# Patient Record
Sex: Female | Born: 1940 | Race: White | Hispanic: No | Marital: Single | Smoking: Never smoker
Health system: Southern US, Community
[De-identification: ages and names within clinical notes are randomized; demographics above are authoritative.]

## PROBLEM LIST (undated history)

## (undated) DIAGNOSIS — M67442 Ganglion, left hand: Secondary | ICD-10-CM

## (undated) DIAGNOSIS — E785 Hyperlipidemia, unspecified: Secondary | ICD-10-CM

## (undated) HISTORY — PX: DENTAL SURGERY: SHX609

## (undated) HISTORY — PX: EYE SURGERY: SHX253

## (undated) HISTORY — PX: FOOT SURGERY: SHX648

## (undated) HISTORY — PX: BACK SURGERY: SHX140

## (undated) HISTORY — PX: BUNIONECTOMY: SHX129

---

## 1998-10-24 ENCOUNTER — Emergency Department (HOSPITAL_COMMUNITY): Admission: EM | Admit: 1998-10-24 | Discharge: 1998-10-24 | Payer: Self-pay | Admitting: Emergency Medicine

## 1998-10-24 ENCOUNTER — Encounter: Payer: Self-pay | Admitting: Emergency Medicine

## 1998-10-28 ENCOUNTER — Emergency Department (HOSPITAL_COMMUNITY): Admission: EM | Admit: 1998-10-28 | Discharge: 1998-10-28 | Payer: Self-pay | Admitting: Emergency Medicine

## 1998-11-03 ENCOUNTER — Emergency Department (HOSPITAL_COMMUNITY): Admission: EM | Admit: 1998-11-03 | Discharge: 1998-11-03 | Payer: Self-pay | Admitting: Emergency Medicine

## 1999-01-31 ENCOUNTER — Other Ambulatory Visit: Admission: RE | Admit: 1999-01-31 | Discharge: 1999-01-31 | Payer: Self-pay | Admitting: Internal Medicine

## 2002-11-08 ENCOUNTER — Encounter: Admission: RE | Admit: 2002-11-08 | Discharge: 2002-11-08 | Payer: Self-pay | Admitting: Orthopedic Surgery

## 2002-11-08 ENCOUNTER — Encounter: Payer: Self-pay | Admitting: Orthopedic Surgery

## 2004-12-18 ENCOUNTER — Emergency Department (HOSPITAL_COMMUNITY): Admission: EM | Admit: 2004-12-18 | Discharge: 2004-12-18 | Payer: Self-pay | Admitting: Emergency Medicine

## 2005-11-18 ENCOUNTER — Ambulatory Visit: Payer: Self-pay | Admitting: Internal Medicine

## 2005-11-28 ENCOUNTER — Ambulatory Visit: Payer: Self-pay | Admitting: Internal Medicine

## 2005-11-28 ENCOUNTER — Encounter (INDEPENDENT_AMBULATORY_CARE_PROVIDER_SITE_OTHER): Payer: Self-pay | Admitting: Specialist

## 2006-02-24 ENCOUNTER — Encounter: Admission: RE | Admit: 2006-02-24 | Discharge: 2006-03-24 | Payer: Self-pay | Admitting: Internal Medicine

## 2006-12-31 ENCOUNTER — Encounter: Admission: RE | Admit: 2006-12-31 | Discharge: 2006-12-31 | Payer: Self-pay | Admitting: Internal Medicine

## 2009-08-22 ENCOUNTER — Emergency Department (HOSPITAL_COMMUNITY): Admission: EM | Admit: 2009-08-22 | Discharge: 2009-08-23 | Payer: Self-pay | Admitting: Emergency Medicine

## 2009-09-06 ENCOUNTER — Encounter: Admission: RE | Admit: 2009-09-06 | Discharge: 2009-09-06 | Payer: Self-pay | Admitting: Internal Medicine

## 2010-06-23 ENCOUNTER — Encounter: Payer: Self-pay | Admitting: Internal Medicine

## 2010-08-19 ENCOUNTER — Other Ambulatory Visit: Payer: Self-pay | Admitting: Internal Medicine

## 2010-08-19 DIAGNOSIS — Z1231 Encounter for screening mammogram for malignant neoplasm of breast: Secondary | ICD-10-CM

## 2010-08-26 LAB — CBC
HCT: 35.6 % — ABNORMAL LOW (ref 36.0–46.0)
Hemoglobin: 12 g/dL (ref 12.0–15.0)
MCHC: 33.7 g/dL (ref 30.0–36.0)
RDW: 13.9 % (ref 11.5–15.5)

## 2010-08-26 LAB — BRAIN NATRIURETIC PEPTIDE: Pro B Natriuretic peptide (BNP): <30 pg/mL (ref 0.0–100.0)

## 2010-08-26 LAB — COMPREHENSIVE METABOLIC PANEL
Alkaline Phosphatase: 52 U/L (ref 39–117)
BUN: 20 mg/dL (ref 6–23)
Calcium: 9.1 mg/dL (ref 8.4–10.5)
GFR calc non Af Amer: >60 mL/min (ref >60)
Glucose, Bld: 100 mg/dL — ABNORMAL HIGH (ref 70–99)
Total Protein: 6.7 g/dL (ref 6.0–8.3)

## 2010-08-26 LAB — POCT CARDIAC MARKERS
Myoglobin, poc: 31.6 ng/mL (ref 12–200)
Troponin i, poc: <0.05 ng/mL (ref 0.00–0.09)

## 2010-08-26 LAB — DIFFERENTIAL
Basophils Relative: 0 % (ref 0–1)
Lymphs Abs: 2.4 10*3/uL (ref 0.7–4.0)
Monocytes Relative: 9 % (ref 3–12)
Neutro Abs: 6 10*3/uL (ref 1.7–7.7)
Neutrophils Relative %: 63 % (ref 43–77)

## 2010-08-26 LAB — D-DIMER, QUANTITATIVE: D-Dimer, Quant: 0.25 ug/mL-FEU (ref 0.00–0.48)

## 2010-10-08 ENCOUNTER — Ambulatory Visit: Payer: Self-pay

## 2010-10-08 ENCOUNTER — Ambulatory Visit
Admission: RE | Admit: 2010-10-08 | Discharge: 2010-10-08 | Disposition: A | Discharge status: Home / Self Care | Payer: Medicare Other | Source: Ambulatory Visit | Attending: Internal Medicine | Admitting: Internal Medicine

## 2010-10-08 DIAGNOSIS — Z1231 Encounter for screening mammogram for malignant neoplasm of breast: Secondary | ICD-10-CM

## 2010-10-09 ENCOUNTER — Other Ambulatory Visit: Payer: Self-pay | Admitting: Internal Medicine

## 2010-10-09 DIAGNOSIS — R928 Other abnormal and inconclusive findings on diagnostic imaging of breast: Secondary | ICD-10-CM

## 2010-10-16 ENCOUNTER — Ambulatory Visit
Admission: RE | Admit: 2010-10-16 | Discharge: 2010-10-16 | Disposition: A | Discharge status: Home / Self Care | Payer: Medicare Other | Source: Ambulatory Visit | Attending: Internal Medicine | Admitting: Internal Medicine

## 2010-10-16 DIAGNOSIS — R928 Other abnormal and inconclusive findings on diagnostic imaging of breast: Secondary | ICD-10-CM

## 2010-12-17 ENCOUNTER — Encounter: Payer: Self-pay | Admitting: Internal Medicine

## 2011-08-21 ENCOUNTER — Other Ambulatory Visit: Payer: Self-pay | Admitting: Orthopedic Surgery

## 2011-08-22 ENCOUNTER — Encounter (HOSPITAL_BASED_OUTPATIENT_CLINIC_OR_DEPARTMENT_OTHER): Payer: Self-pay | Admitting: *Deleted

## 2011-08-25 ENCOUNTER — Encounter (HOSPITAL_BASED_OUTPATIENT_CLINIC_OR_DEPARTMENT_OTHER): Payer: Self-pay | Admitting: Anesthesiology

## 2011-08-25 ENCOUNTER — Encounter (HOSPITAL_BASED_OUTPATIENT_CLINIC_OR_DEPARTMENT_OTHER)
Admission: RE | Disposition: A | Discharge status: Home / Self Care | Payer: Self-pay | Source: Ambulatory Visit | Attending: Orthopedic Surgery

## 2011-08-25 ENCOUNTER — Encounter (HOSPITAL_BASED_OUTPATIENT_CLINIC_OR_DEPARTMENT_OTHER): Payer: Self-pay | Admitting: Orthopedic Surgery

## 2011-08-25 ENCOUNTER — Encounter (HOSPITAL_BASED_OUTPATIENT_CLINIC_OR_DEPARTMENT_OTHER): Payer: Self-pay | Admitting: *Deleted

## 2011-08-25 ENCOUNTER — Ambulatory Visit (HOSPITAL_BASED_OUTPATIENT_CLINIC_OR_DEPARTMENT_OTHER): Payer: Medicare Other | Admitting: Anesthesiology

## 2011-08-25 ENCOUNTER — Ambulatory Visit (HOSPITAL_BASED_OUTPATIENT_CLINIC_OR_DEPARTMENT_OTHER)
Admission: RE | Admit: 2011-08-25 | Discharge: 2011-08-25 | Disposition: A | Discharge status: Home / Self Care | Payer: Medicare Other | Source: Ambulatory Visit | Attending: Orthopedic Surgery | Admitting: Orthopedic Surgery

## 2011-08-25 DIAGNOSIS — M6740 Ganglion, unspecified site: Secondary | ICD-10-CM | POA: Insufficient documentation

## 2011-08-25 HISTORY — PX: MASS EXCISION: SHX2000

## 2011-08-25 SURGERY — EXCISION MASS
Anesthesia: Regional | Site: Finger | Laterality: Left | Wound class: Clean

## 2011-08-25 MED ORDER — LACTATED RINGERS IV SOLN
INTRAVENOUS | Status: DC
  Administered 2011-08-25: 14:00:00 via INTRAVENOUS

## 2011-08-25 MED ORDER — FENTANYL CITRATE 0.05 MG/ML IJ SOLN
INTRAMUSCULAR | Status: DC | PRN
  Administered 2011-08-25: 100 ug via INTRAVENOUS

## 2011-08-25 MED ORDER — FENTANYL CITRATE 0.05 MG/ML IJ SOLN: 50.0000 ug | INTRAMUSCULAR | Status: DC | PRN

## 2011-08-25 MED ORDER — CEFAZOLIN SODIUM 1-5 GM-% IV SOLN
INTRAVENOUS | Status: DC | PRN
  Administered 2011-08-25: 1 g via INTRAVENOUS

## 2011-08-25 MED ORDER — BUPIVACAINE HCL (PF) 0.25 % IJ SOLN
INTRAMUSCULAR | Status: DC | PRN
  Administered 2011-08-25: 5 mL

## 2011-08-25 MED ORDER — METOCLOPRAMIDE HCL 5 MG/ML IJ SOLN: 10.0000 mg | Freq: Once | INTRAMUSCULAR | Status: DC | PRN

## 2011-08-25 MED ORDER — MORPHINE SULFATE 2 MG/ML IJ SOLN: 0.0500 mg/kg | INTRAMUSCULAR | Status: DC | PRN

## 2011-08-25 MED ORDER — CHLORHEXIDINE GLUCONATE 4 % EX LIQD: 60.0000 mL | Freq: Once | CUTANEOUS | Status: DC

## 2011-08-25 MED ORDER — MIDAZOLAM HCL 5 MG/5ML IJ SOLN
INTRAMUSCULAR | Status: DC | PRN
  Administered 2011-08-25: 1 mg via INTRAVENOUS

## 2011-08-25 MED ORDER — ONDANSETRON HCL 4 MG/2ML IJ SOLN
INTRAMUSCULAR | Status: DC | PRN
  Administered 2011-08-25: 4 mg via INTRAVENOUS

## 2011-08-25 MED ORDER — MIDAZOLAM HCL 2 MG/2ML IJ SOLN: 0.5000 mg | INTRAMUSCULAR | Status: DC | PRN

## 2011-08-25 MED ORDER — PROPOFOL 10 MG/ML IV EMUL
INTRAVENOUS | Status: DC | PRN
  Administered 2011-08-25: 50 ug/kg/min via INTRAVENOUS

## 2011-08-25 MED ORDER — HYDROCODONE-ACETAMINOPHEN 5-500 MG PO TABS: 1.0000 | ORAL_TABLET | ORAL | Status: AC | PRN

## 2011-08-25 MED ORDER — FENTANYL CITRATE 0.05 MG/ML IJ SOLN: 25.0000 ug | INTRAMUSCULAR | Status: DC | PRN

## 2011-08-25 MED ORDER — LIDOCAINE HCL (PF) 0.5 % IJ SOLN
INTRAMUSCULAR | Status: DC | PRN
  Administered 2011-08-25: 30 mL via INTRATHECAL

## 2011-08-25 SURGICAL SUPPLY — 51 items
BANDAGE COBAN STERILE 2 (GAUZE/BANDAGES/DRESSINGS) IMPLANT
BANDAGE GAUZE ELAST BULKY 4 IN (GAUZE/BANDAGES/DRESSINGS) IMPLANT
BLADE MINI RND TIP GREEN BEAV (BLADE) IMPLANT
BLADE SURG 15 STRL LF DISP TIS (BLADE) ×1 IMPLANT
BLADE SURG 15 STRL SS (BLADE) ×2
BNDG CMPR 9X4 STRL LF SNTH (GAUZE/BANDAGES/DRESSINGS)
BNDG COHESIVE 1X5 TAN STRL LF (GAUZE/BANDAGES/DRESSINGS) ×1 IMPLANT
BNDG COHESIVE 3X5 TAN STRL LF (GAUZE/BANDAGES/DRESSINGS) IMPLANT
BNDG ESMARK 4X9 LF (GAUZE/BANDAGES/DRESSINGS) IMPLANT
CHLORAPREP W/TINT 26ML (MISCELLANEOUS) ×2 IMPLANT
CLOTH BEACON ORANGE TIMEOUT ST (SAFETY) ×2 IMPLANT
CORDS BIPOLAR (ELECTRODE) ×2 IMPLANT
COVER MAYO STAND STRL (DRAPES) ×2 IMPLANT
COVER TABLE BACK 60X90 (DRAPES) ×2 IMPLANT
CUFF TOURNIQUET SINGLE 18IN (TOURNIQUET CUFF) ×2 IMPLANT
DECANTER SPIKE VIAL GLASS SM (MISCELLANEOUS) IMPLANT
DRAIN PENROSE 1/2X12 LTX STRL (WOUND CARE) IMPLANT
DRAPE EXTREMITY T 121X128X90 (DRAPE) ×2 IMPLANT
DRAPE SURG 17X23 STRL (DRAPES) ×2 IMPLANT
GAUZE XEROFORM 1X8 LF (GAUZE/BANDAGES/DRESSINGS) ×2 IMPLANT
GLOVE BIO SURGEON STRL SZ 6.5 (GLOVE) ×1 IMPLANT
GLOVE INDICATOR 8.0 STRL GRN (GLOVE) ×1 IMPLANT
GLOVE SURG ORTHO 8.0 STRL STRW (GLOVE) ×2 IMPLANT
GLOVE SURG SS PI 8.0 STRL IVOR (GLOVE) ×1 IMPLANT
GOWN BRE IMP PREV XXLGXLNG (GOWN DISPOSABLE) ×3 IMPLANT
GOWN PREVENTION PLUS XLARGE (GOWN DISPOSABLE) IMPLANT
NDL SAFETY ECLIPSE 18X1.5 (NEEDLE) ×1 IMPLANT
NEEDLE 27GAX1X1/2 (NEEDLE) ×1 IMPLANT
NEEDLE HYPO 18GX1.5 SHARP (NEEDLE)
NS IRRIG 1000ML POUR BTL (IV SOLUTION) ×2 IMPLANT
PACK BASIN DAY SURGERY FS (CUSTOM PROCEDURE TRAY) ×2 IMPLANT
PAD CAST 3X4 CTTN HI CHSV (CAST SUPPLIES) IMPLANT
PADDING CAST ABS 3INX4YD NS (CAST SUPPLIES)
PADDING CAST ABS 4INX4YD NS (CAST SUPPLIES) ×1
PADDING CAST ABS COTTON 3X4 (CAST SUPPLIES) IMPLANT
PADDING CAST ABS COTTON 4X4 ST (CAST SUPPLIES) ×1 IMPLANT
PADDING CAST COTTON 3X4 STRL (CAST SUPPLIES)
SPLINT FINGER 5/8X3.25 (SOFTGOODS) IMPLANT
SPLINT FINGER FOAM 3 9119 05 (SOFTGOODS) ×2
SPLINT PLASTER CAST XFAST 3X15 (CAST SUPPLIES) IMPLANT
SPLINT PLASTER XTRA FASTSET 3X (CAST SUPPLIES)
SPONGE GAUZE 4X4 12PLY (GAUZE/BANDAGES/DRESSINGS) ×2 IMPLANT
STOCKINETTE 4X48 STRL (DRAPES) ×2 IMPLANT
SUT VIC AB 4-0 P2 18 (SUTURE) IMPLANT
SUT VICRYL RAPID 5 0 P 3 (SUTURE) IMPLANT
SUT VICRYL RAPIDE 4/0 PS 2 (SUTURE) ×2 IMPLANT
SYR BULB 3OZ (MISCELLANEOUS) ×2 IMPLANT
SYR CONTROL 10ML LL (SYRINGE) ×1 IMPLANT
TOWEL OR 17X24 6PK STRL BLUE (TOWEL DISPOSABLE) ×3 IMPLANT
UNDERPAD 30X30 INCONTINENT (UNDERPADS AND DIAPERS) ×2 IMPLANT
WATER STERILE IRR 1000ML POUR (IV SOLUTION) ×1 IMPLANT

## 2011-08-25 NOTE — Discharge Instructions (Addendum)
[facility] Instructions Hand Surgery  Wound Care: Keep your hand elevated above the level of your heart.  Do not allow it to dangle  by your side.  Keep the dressing dry and do not remove it unless your doctor advises you to do so.  He will usually change it at the time of your post-op visit.  Moving your fingers is advised to stimulate circulation but will depend on the site of your surgery.  If you have a splint applied, your doctor will advise you regarding movement.  Activity: Do not drive or operate machinery today.  Rest today and then you may return to your normal activity and work as indicated by your physician.  Diet:  Drink liquids today or eat a light diet.  You may resume a regular diet tomorrow.    General expectations: Pain for two to three days. Fingers may become slightly swollen.  Call your doctor if any of the following occur: Severe pain not relieved by pain medication. Elevated temperature. Dressing soaked with blood. Inability to move fingers. White or bluish color to fingers.[facility]  [address] [place], [place] [address] [phone]   Post Anesthesia Home Care Instructions  Activity: Get plenty of rest for the remainder of the day. A responsible adult should stay with you for 24 hours following the procedure.  For the next 24 hours, DO NOT: -Drive a car -Operate machinery -Drink alcoholic beverages -Take any medication unless instructed by your physician -Make any legal decisions or sign important papers.  Meals: Start with liquid foods such as gelatin or soup. Progress to regular foods as tolerated. Avoid greasy, spicy, heavy foods. If nausea and/or vomiting occur, drink only clear liquids until the nausea and/or vomiting subsides. Call your physician if vomiting continues.  Special Instructions/Symptoms: Your throat may feel dry or sore from the anesthesia or the breathing tube placed in your throat during surgery. If  this causes discomfort, gargle with warm salt water. The discomfort should disappear within 24 hours.   

## 2011-08-25 NOTE — Op Note (Signed)
NAMELIVIE, VANDERHOOF              ACCOUNT NO.:  [id number]  MEDICAL RECORD NO.:  [id number]  LOCATION:                                 FACILITY:  PHYSICIAN:  Cindee Salt, M.D.            DATE OF BIRTH:  DATE OF PROCEDURE:  08/25/2011 DATE OF DISCHARGE:                              OPERATIVE REPORT   PREOPERATIVE DIAGNOSIS:  Mucoid cyst, distal interphalangeal joint, left middle finger.  POSTOPERATIVE DIAGNOSIS:  Mucoid cyst, distal interphalangeal joint, left middle finger.  OPERATION:  Excision of mucoid cyst, debridement of distal interphalangeal joint, left middle finger  SURGEON:  Cindee Salt, M.D.  ANESTHESIA:  Forearm-based IV regional.  ANESTHESIOLOGIST:  Janetta Hora. Gelene Mink, M.D.  HISTORY:  The patient is a 71 year old female with a history of a mass, dorsal aspect of her left middle finger with grooving of the nail plate distally.  X-rays revealed degenerative changes in distal interphalangeal joint.  She is desirous having this excised.  Pre, peri, and postoperative course have been discussed along with risks and complications. She is aware there is no guarantee with surgery; possibility of infection; recurrence; injury to arteries, nerves, tendons; incomplete relief of symptoms; dystrophy; and the possibility of deformity to the nail plate.  In preoperative area, the patient is seen, the extremity marked by both the patient and surgeon.  Antibiotic given.  PROCEDURE:  The patient was brought to the operating room where a forearm-based IV regional anesthetic was carried out without difficulty. She was prepped using ChloraPrep, supine position, left arm free.  A 3- minute dry time was allowed.  Time-out taken, confirming the patient and procedure.  A curvilinear incision was made over the distal interphalangeal joint of left middle finger, carried down through subcutaneous tissue.  The dissection was carried distally beneath the skin.  With blunt and sharp  dissection, the cyst was isolated, this was then opened and removed with a small rongeur, taking care to protect the overlying skin, which was translucent.  This was not ruptured and a portion of cyst was sent to Pathology.  The joint was then opened on its ulnar aspect.  With blunt and sharp dissection, a debridement was then performed again with a small rongeur.  Synovectomy performed.  No further lesions were identified.  The wound was irrigated with saline and the skin closed with Vicryl 5-0 Vicryl Rapide sutures.  A metacarpal block was given with 0.25% Marcaine without epinephrine, 5 mL was used.  Sterile compressive dressing and splint to the finger was applied.  On deflation of the tourniquet, the remaining fingers pinked.  She was taken to the recovery room for observation in satisfactory condition.          ______________________________ Cindee Salt, M.D.     GK/MEDQ  D:  08/25/2011  T:  08/25/2011  Job:  [id number]

## 2011-08-25 NOTE — Brief Op Note (Signed)
08/25/2011  3:08 PM  PATIENT:  Charolette L Carbary  71 y.o. female  PRE-OPERATIVE DIAGNOSIS:  mucoid tumor left middle finger  POST-OPERATIVE DIAGNOSIS:  Mucoid Tumor Left Middle Finger  PROCEDURE:  Procedure(s) (LRB): EXCISION MASS (Left)  SURGEON:  Surgeon(s) and Role:    * Nicki Reaper, MD - Primary  PHYSICIAN ASSISTANT:   ASSISTANTS: none   ANESTHESIA:   local and regional  EBL:     BLOOD ADMINISTERED:none  DRAINS: none   LOCAL MEDICATIONS USED:  MARCAINE     SPECIMEN:  Excision  DISPOSITION OF SPECIMEN:  PATHOLOGY  COUNTS:  YES  TOURNIQUET:   Total Tourniquet Time Documented: Forearm (Left) - 21 minutes  DICTATION: .Other Dictation: Dictation Number [id number]  PLAN OF CARE: Discharge to home after PACU  PATIENT DISPOSITION:  PACU - hemodynamically stable.

## 2011-08-25 NOTE — H&P (Signed)
  Meredith Hughes  is a 71 year-old left-hand dominant female referred by Dr. Arminda Resides for consultation with respect to a mass on her left little finger distal phalanx nail bed.  She states that this has been present for approximately three months. She recalls no history of injury.  She states that it has not opened or drained anything. She states that she noticed this after having a manicure done with the deformity of the nail which they were unable to correct.  She has no history of diabetes, thyroid problems, arthritis or gout.  There is a family history of arthritis.  She complains of an intermittent moderate throbbing aching type pain with a feeling of swelling and the deformity to the nail plate.  She states rest makes it better.  ALLERGIES:   Sulfa.  MEDICATIONS:    Acyclovir, simvastatin.   SURGICAL HISTORY:     Back surgery, dental implantation surgery, bunion surgery.   FAMILY MEDICAL HISTORY:    Positive for heart disease, arthritis.   SOCIAL HISTORY:    She does not smoke or drink.  She is divorced.    REVIEW OF SYSTEMS:   Positive for cataracts, hearing loss, easy bruising, otherwise negative.   Meredith Hughes is an 71 y.o. female.   Chief Complaint: mucoid cyst lmf  HPI: see above  History reviewed. No pertinent past medical history.  Past Surgical History  Procedure Date  . Back surgery   . Eye surgery     LT CATARACT  . Dental surgery     IMPLANTS  . Bunionectomy     LT  . Foot surgery     RT    History reviewed. No pertinent family history. Social History:  reports that she has never smoked. She does not have any smokeless tobacco history on file. She reports that she does not use illicit drugs. Her alcohol history not on file.  Allergies:  Allergies  Allergen Reactions  . Sulfa Antibiotics Rash    No current facility-administered medications on file as of .   Medications Prior to Admission  Medication Sig Dispense Refill  . acyclovir (ZOVIRAX) 400 MG tablet  Take 400 mg by mouth every 4 (four) hours while awake.      . simvastatin (ZOCOR) 10 MG tablet Take 10 mg by mouth at bedtime.        No results found for this or any previous visit (from the past 48 hour(s)).  No results found.   Pertinent items are noted in HPI.  Height 5\' 4"  (1.626 m), weight 66.679 kg (147 lb).  General appearance: alert, cooperative and appears stated age Head: Normocephalic, without obvious abnormality Neck: no adenopathy Resp: clear to auscultation bilaterally Cardio: regular rate and rhythm, S1, S2 normal, no murmur, click, rub or gallop GI: soft, non-tender; bowel sounds normal; no masses,  no organomegaly Extremities: extremities normal, atraumatic, no cyanosis or edema Pulses: 2+ and symmetric Skin: Skin color, texture, turgor normal. No rashes or lesions Neurologic: Grossly normal Incision/Wound: na  Assessment/Plan   We would recommend surgical excision, debridement of the joint.  The pre, peri and postoperative course were discussed along with the risks and complications.  The patient is aware there is no guarantee with the surgery, possibility of infection, recurrence, injury to arteries, nerves, tendons, recurrence.  She would like to proceed to have this done.    Rosalea Withrow R 08/25/2011, 12:19 PM

## 2011-08-25 NOTE — Transfer of Care (Signed)
Immediate Anesthesia Transfer of Care Note  Patient: Meredith Hughes  Procedure(s) Performed: Procedure(s) (LRB): EXCISION MASS (Left)  Patient Location: PACU  Anesthesia Type: MAC and Bier block  Level of Consciousness: awake, alert  and oriented  Airway & Oxygen Therapy: Patient Spontanous Breathing and Patient connected to face mask oxygen  Post-op Assessment: Report given to PACU RN and Post -op Vital signs reviewed and stable  Post vital signs: Reviewed and stable  Complications: No apparent anesthesia complications

## 2011-08-25 NOTE — Anesthesia Postprocedure Evaluation (Signed)
Anesthesia Post Note  Patient: Meredith Hughes  Procedure(s) Performed: Procedure(s) (LRB): EXCISION MASS (Left)  Anesthesia type: MAC  Patient location: PACU  Post pain: Pain level controlled  Post assessment: Patient's Cardiovascular Status Stable  Last Vitals:  Filed Vitals:   08/25/11 1545  BP:   Pulse: 55  Temp:   Resp: 15    Post vital signs: Reviewed and stable  Level of consciousness: alert  Complications: No apparent anesthesia complications

## 2011-08-25 NOTE — Anesthesia Preprocedure Evaluation (Signed)
Anesthesia Evaluation  Patient identified by MRN, date of birth, ID band Patient awake    Reviewed: Allergy & Precautions, H&P , NPO status , Patient's Chart, lab work & pertinent test results, reviewed documented beta blocker date and time   Airway Mallampati: II TM Distance: >3 FB Neck ROM: full    Dental   Pulmonary neg pulmonary ROS,          Cardiovascular negative cardio ROS      Neuro/Psych negative neurological ROS  negative psych ROS   GI/Hepatic negative GI ROS, Neg liver ROS,   Endo/Other  negative endocrine ROS  Renal/GU negative Renal ROS  negative genitourinary   Musculoskeletal   Abdominal   Peds  Hematology negative hematology ROS (+)   Anesthesia Other Findings See surgeon's H&P   Reproductive/Obstetrics negative OB ROS                           Anesthesia Physical Anesthesia Plan  ASA: II  Anesthesia Plan: MAC and Bier Block   Post-op Pain Management:    Induction: Intravenous  Airway Management Planned: Simple Face Mask  Additional Equipment:   Intra-op Plan:   Post-operative Plan:   Informed Consent: I have reviewed the patients History and Physical, chart, labs and discussed the procedure including the risks, benefits and alternatives for the proposed anesthesia with the patient or authorized representative who has indicated his/her understanding and acceptance.     Plan Discussed with: CRNA and Surgeon  Anesthesia Plan Comments:         Anesthesia Quick Evaluation

## 2011-08-25 NOTE — Op Note (Signed)
Dictated [id number]

## 2011-08-26 ENCOUNTER — Encounter (HOSPITAL_BASED_OUTPATIENT_CLINIC_OR_DEPARTMENT_OTHER): Payer: Self-pay | Admitting: Orthopedic Surgery

## 2011-08-26 LAB — POCT HEMOGLOBIN-HEMACUE: Hemoglobin: 12.6 g/dL (ref 12.0–15.0)

## 2011-09-28 ENCOUNTER — Emergency Department (HOSPITAL_COMMUNITY): Payer: Medicare Other

## 2011-09-28 ENCOUNTER — Encounter (HOSPITAL_COMMUNITY): Payer: Self-pay | Admitting: Emergency Medicine

## 2011-09-28 ENCOUNTER — Emergency Department (HOSPITAL_COMMUNITY)
Admission: EM | Admit: 2011-09-28 | Discharge: 2011-09-29 | Disposition: A | Discharge status: Home / Self Care | Payer: Medicare Other | Attending: Emergency Medicine | Admitting: Emergency Medicine

## 2011-09-28 DIAGNOSIS — J159 Unspecified bacterial pneumonia: Secondary | ICD-10-CM | POA: Insufficient documentation

## 2011-09-28 DIAGNOSIS — R21 Rash and other nonspecific skin eruption: Secondary | ICD-10-CM | POA: Insufficient documentation

## 2011-09-28 DIAGNOSIS — E785 Hyperlipidemia, unspecified: Secondary | ICD-10-CM | POA: Insufficient documentation

## 2011-09-28 HISTORY — DX: Hyperlipidemia, unspecified: E78.5

## 2011-09-28 LAB — CBC
HCT: 35.7 % — ABNORMAL LOW (ref 36.0–46.0)
Hemoglobin: 12.4 g/dL (ref 12.0–15.0)
MCH: 30.3 pg (ref 26.0–34.0)
MCHC: 34.7 g/dL (ref 30.0–36.0)
MCV: 87.3 fL (ref 78.0–100.0)
Platelets: 176 10*3/uL (ref 150–400)
RBC: 4.09 MIL/uL (ref 3.87–5.11)
RDW: 13.1 % (ref 11.5–15.5)
WBC: 8.3 10*3/uL (ref 4.0–10.5)

## 2011-09-28 LAB — DIFFERENTIAL
Basophils Absolute: 0 10*3/uL (ref 0.0–0.1)
Basophils Relative: 0 % (ref 0–1)
Eosinophils Absolute: 0.2 10*3/uL (ref 0.0–0.7)
Eosinophils Relative: 2 % (ref 0–5)
Lymphocytes Relative: 6 % — ABNORMAL LOW (ref 12–46)
Lymphs Abs: 0.5 10*3/uL — ABNORMAL LOW (ref 0.7–4.0)
Monocytes Absolute: 0.3 10*3/uL (ref 0.1–1.0)
Monocytes Relative: 3 % (ref 3–12)
Neutro Abs: 7.4 10*3/uL (ref 1.7–7.7)
Neutrophils Relative %: 89 % — ABNORMAL HIGH (ref 43–77)

## 2011-09-28 LAB — COMPREHENSIVE METABOLIC PANEL
ALT: 21 U/L (ref 0–35)
AST: 24 U/L (ref 0–37)
Albumin: 4.1 g/dL (ref 3.5–5.2)
Alkaline Phosphatase: 61 U/L (ref 39–117)
BUN: 15 mg/dL (ref 6–23)
CO2: 22 mEq/L (ref 19–32)
Calcium: 8.9 mg/dL (ref 8.4–10.5)
Chloride: 97 mEq/L (ref 96–112)
Creatinine, Ser: 0.69 mg/dL (ref 0.50–1.10)
GFR calc Af Amer: >90 mL/min (ref >90)
GFR calc non Af Amer: 84 mL/min — ABNORMAL LOW (ref >90)
Glucose, Bld: 105 mg/dL — ABNORMAL HIGH (ref 70–99)
Potassium: 3.7 mEq/L (ref 3.5–5.1)
Sodium: 132 mEq/L — ABNORMAL LOW (ref 135–145)
Total Bilirubin: 0.6 mg/dL (ref 0.3–1.2)
Total Protein: 6.9 g/dL (ref 6.0–8.3)

## 2011-09-28 LAB — URINALYSIS, ROUTINE W REFLEX MICROSCOPIC
Bilirubin Urine: NEGATIVE
Glucose, UA: NEGATIVE mg/dL
Ketones, ur: NEGATIVE mg/dL
Nitrite: NEGATIVE
Protein, ur: NEGATIVE mg/dL
Specific Gravity, Urine: 1.01 (ref 1.005–1.030)
Urobilinogen, UA: 0.2 mg/dL (ref 0.0–1.0)
pH: 6 (ref 5.0–8.0)

## 2011-09-28 LAB — URINE MICROSCOPIC-ADD ON

## 2011-09-28 MED ORDER — ACETAMINOPHEN 325 MG PO TABS
650.0000 mg | ORAL_TABLET | Freq: Once | ORAL | Status: AC
  Administered 2011-09-28: 650 mg via ORAL
  Filled 2011-09-28: qty 2
  Filled 2011-09-28: qty 3

## 2011-09-28 MED ORDER — ACETAMINOPHEN 325 MG PO TABS: 325.0000 mg | ORAL_TABLET | Freq: Once | ORAL | Status: DC

## 2011-09-28 NOTE — ED Provider Notes (Signed)
Patient presents to the emergency room with a systemic rash and some chills. The patient's x-rays suggest the possibility of a pneumonia. The patient's rash is diffuse maculopapular on the extremities and torso.  There's been a recent outbreak of measles. The rash is potentially concerning for that. Patient will be moved into an isolation room. We are waiting on the results first CMP. On exam she does not appear toxic or in any acute distress.  Celene Kras, MD 09/28/11 2239

## 2011-09-28 NOTE — ED Notes (Signed)
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  Patient states that they do not itch.

## 2011-09-28 NOTE — ED Provider Notes (Deleted)
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 Friday evening she noted a red rash to BUE's that was mildly pruritic and TTP. Pt noted mild chills by Friday night, took Tylenol and felt better. Sat am the rash seemed to be better and she worked her part time job as usual. By Sat night pt had repeated onset of chills and temp of 103. Pt states at this point rash worsening and now noted to abd, back, BLE's and BUE's. Sat night pt describes rigors w/ her fever. This am again rash appeared to be improving and pt states she was feeling normal. Worked again today and bebore leaving work she noted again worsening rash, chills and fever. Pt denies CP, N/V/D, SOB, URI sx's or any recent illnesses. States she recently visited her sister in [place] who was ill with "a parasite" which caused GI sx's but has had no other known ill contacts. BBS CTA, HR 92 w/ RRR, Diffuse erythematous,  maculopapular rash to torso, BUE's and BLE's. Pt has been on Acyclovir for 4 to 5 yrs s/p a "rash" that was Shingles like in nature though was never dx'd w/ shingles and staes the rash was never painful. Only other PMH is high cholesterol.  Dr Roselyn Bering has seen pt. Will move to exam room.  Leanne Chang, NP 09/28/11 [phone]

## 2011-09-29 MED ORDER — MOXIFLOXACIN HCL 400 MG PO TABS
400.0000 mg | ORAL_TABLET | Freq: Once | ORAL | Status: AC
  Administered 2011-09-29: 400 mg via ORAL
  Filled 2011-09-29: qty 1

## 2011-09-29 NOTE — ED Notes (Signed)
Patient is AOx4 and comfortable with her discharge instructions. 

## 2011-09-29 NOTE — ED Provider Notes (Signed)
History     CSN: [id number]  Arrival date & time 09/28/11  1932   First MD Initiated Contact with Patient 09/28/11 2008      Chief Complaint  Patient presents with  . Rash    HPI: Patient is a 71 y.o. female presenting with rash. The history is provided by the patient.  Rash  This is a new problem. The current episode started more than 2 days ago. The problem has been gradually worsening. The problem is associated with plant contact. The maximum temperature recorded prior to her arrival was 103 to 104 F. The fever has been present for 1 to 2 days. The rash is present on the trunk, right arm, right upper leg, right lower leg, left upper leg and left arm. The pain is at a severity of 0/10. The patient is experiencing no pain. The pain has been constant since onset. Associated symptoms include pain. Pertinent negatives include no blisters, no itching and no weeping.  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 Friday evening she noted a red rash to BUE's that was mildly pruritic and TTP. Pt noted mild chills by Friday night, took Tylenol and felt better. Sat am the rash seemed to be better and she worked her part time job as usual. By Sat night pt had repeated onset of chills and temp of 103. Pt states at this point rash worsening and now noted to abd, back, BLE's and BUE's. Sat night pt describes rigors w/ her fever. This am again rash appeared to be improving and pt states she was feeling normal. Worked again today and bebore leaving work she noted again worsening rash, chills and fever. Pt denies CP, N/V/D, SOB, URI sx's or any recent illnesses. States she recently visited her sister in [place] who was ill with "a parasite" which caused GI sx's but has had no other known ill contacts. . Pt has been on Acyclovir for 4 to 5 yrs s/p a "rash" that was Shingles like in nature though was never dx'd w/ shingles and staes the rash was never painful. Only other PMH is high cholesterol.      Past Medical History  Diagnosis Date  . Hyperlipidemia     Past Surgical History  Procedure Date  . Back surgery   . Eye surgery     LT CATARACT  . Dental surgery     IMPLANTS  . Bunionectomy     LT  . Foot surgery     RT  . Mass excision 08/25/2011    Procedure: EXCISION MASS;  Surgeon: Nicki Reaper, MD;  Location: [facility];  Service: Orthopedics;  Laterality: Left;  excision cyst, debridement dip joint left middle finger    History reviewed. No pertinent family history.  History  Substance Use Topics  . Smoking status: Never Smoker   . Smokeless tobacco: Not on file  . Alcohol Use: No     SOCIAL    OB History    Grav Para Term Preterm Abortions TAB SAB Ect Mult Living                  Review of Systems  Constitutional: Positive for fever and chills.  HENT: Negative.  Negative for facial swelling.   Eyes: Negative.   Respiratory: Negative.  Negative for cough, chest tightness and shortness of breath.   Cardiovascular: Negative for chest pain.  Gastrointestinal: Negative.  Negative for nausea, vomiting, abdominal pain, diarrhea and constipation.  Genitourinary: Negative.  Negative for dysuria, frequency, hematuria and difficulty urinating.  Musculoskeletal: Negative.   Skin: Positive for rash. Negative for itching.  Neurological: Negative.   Hematological: Negative.   Psychiatric/Behavioral: Negative.     Allergies  Sulfa antibiotics  Home Medications   Current Outpatient Rx  Name Route Sig Dispense Refill  . ACYCLOVIR 400 MG PO TABS Oral Take 400 mg by mouth every 4 (four) hours while awake.    Marland Kitchen SIMVASTATIN 10 MG PO TABS Oral Take 10 mg by mouth at bedtime.      BP 112/38  Pulse 93  Temp(Src) 100.6 F (38.1 C) (Oral)  Resp 16  SpO2 98%  Physical Exam  Constitutional: She is oriented to person, place, and time. She appears well-developed and well-nourished.  Non-toxic appearance. She does not have a sickly appearance. She does  not appear ill. No distress.  Eyes: Conjunctivae are normal.  Neck: Neck supple.  Cardiovascular: Normal rate and regular rhythm.   Pulmonary/Chest: Effort normal and breath sounds normal.  Abdominal: Soft. Bowel sounds are normal.  Musculoskeletal: Normal range of motion.  Neurological: She is alert and oriented to person, place, and time.  Skin: Skin is warm and dry.        Diffuse erythematous,  maculopapular rash to torso, BUE's and BLE's    ED Course  Procedures   Dr Roselyn Bering has seen pt. Will move to exam room. There is some concern for measles so pt placed on isolation. Will wait for titers to be drawn and consult with pt's PCP for plan.  I have discussed pt w/ Dr Evlyn Kanner w/ [facility] who has requested pt be started on PO Avelox and d/c'd home to f/u in office in am. I have discussed this plan w/ pt who is agreeable.     Labs Reviewed  CBC - Abnormal; Notable for the following:    HCT 35.7 (*)    All other components within normal limits  DIFFERENTIAL - Abnormal; Notable for the following:    Neutrophils Relative 89 (*)    Lymphocytes Relative 6 (*)    Lymphs Abs 0.5 (*)    All other components within normal limits  COMPREHENSIVE METABOLIC PANEL - Abnormal; Notable for the following:    Sodium 132 (*)    Glucose, Bld 105 (*)    GFR calc non Af Amer 84 (*)    All other components within normal limits  URINALYSIS, ROUTINE W REFLEX MICROSCOPIC - Abnormal; Notable for the following:    Hgb urine dipstick MODERATE (*)    Leukocytes, UA MODERATE (*)    All other components within normal limits  URINE MICROSCOPIC-ADD ON - Abnormal; Notable for the following:    Squamous Epithelial / LPF FEW (*)    Bacteria, UA FEW (*)    All other components within normal limits  RUBEOLA ANTIBODY IGG  RUBEOLA ANTIBODY, IGM   Dg Chest 2 View  09/28/2011  *RADIOLOGY REPORT*  Clinical Data: Chills, body aches.  CHEST - 2 VIEW  Comparison: 08/22/2009  Findings: Cardiomediastinal  contours within normal limits. Mild patchy lower and middle lobe opacities.  Otherwise no focal consolidation.  No pleural effusion or pneumothorax.  No acute osseous abnormality.  IMPRESSION: Mild patchy lower/middle lobe opacities may reflect atelectasis or early infiltrate.  Original Report Authenticated By: Waneta Martins, M.D.     No diagnosis found.    MDM  HPI/PE and clinical findings c/w 1. Fever ( Source exactly unclear but  CXR w./ findings that could suggest PNA, Avelox started here tonight, pt to see Dr Jacky Kindle in am) 2. Rash (Source unclear, worrisome for measles given pt's age and recent increase in Measles cases in her community, titers pending)        Leanne Chang, NP 09/29/11 0130

## 2011-09-29 NOTE — Discharge Instructions (Signed)
Please read over the instructions below. Though the exact cause of your rash remains unclear there is a concern for Measles given your age and a recent measles outbreak in our community. The lab work that will help [place] make that decision will result in a couple of days. Your chest xray has findings that could suggest a pneumonia, though it is not supported by your lack of respiratory symptoms. Continue Tylenol and or Ibuprofen for fever, aches or pain and call Dr Jacky Kindle in am to get your appointment time for tomorrow.  Return for any worsening symptoms.  Rash A rash is a change in the color or feel of your skin. There are many different types of rashes. You may have other problems along with your rash. HOME CARE  Avoid the thing that caused your rash.   Do not scratch your rash.   You may take cools baths to help stop itching.   Only take medicines as told by your doctor.   Keep all doctor visits as told.  GET HELP RIGHT AWAY IF:   Your pain, puffiness (swelling), or redness gets worse.   You have a fever.   You have new or severe problems.   You have body aches, watery poop (diarrhea), or you throw up (vomit).   Your rash is not better after 3 days.  MAKE SURE YOU:   Understand these instructions.   Will watch your condition.   Will get help right away if you are not doing well or get worse.  Document Released: 11/05/2007 Document Revised: 05/08/2011 Document Reviewed: 03/03/2011 [facility] Patient Information 2012 [place], [place].Pneumonia, Adult Pneumonia is an infection of the lungs. It may be caused by a germ (virus or bacteria). Some types of pneumonia can spread easily from person to person. This can happen when you cough or sneeze. HOME CARE  Only take medicine as told by your doctor.   Take your medicine (antibiotics) as told. Finish it even if you start to feel better.   Do not smoke.   You may use a vaporizer or humidifier in your room. This can help loosen thick  spit (mucus).   Sleep so you are almost sitting up (semi-upright). This helps reduce coughing.   Rest.  A shot (vaccine) can help prevent pneumonia. Shots are often advised for:  People over 21 years old.   Patients on chemotherapy.   People with long-term (chronic) lung problems.   People with immune system problems.  GET HELP RIGHT AWAY IF:   You are getting worse.   You cannot control your cough, and you are losing sleep.   You cough up blood.   Your pain gets worse, even with medicine.   You have a fever.   Any of your problems are getting worse, not better.   You have shortness of breath or chest pain.  MAKE SURE YOU:   Understand these instructions.   Will watch your condition.   Will get help right away if you are not doing well or get worse.  Document Released: 11/05/2007 Document Revised: 05/08/2011 Document Reviewed: 08/09/2010 [facility] Patient Information 2012 [place], [place].

## 2011-09-30 LAB — RUBEOLA ANTIBODY, IGM: Rubeola Antibodies, IgM: <1:20 {titer}

## 2011-09-30 LAB — RUBEOLA ANTIBODY IGG: Rubeola IgG: 3.87 {ISR} — ABNORMAL HIGH

## 2011-10-28 ENCOUNTER — Other Ambulatory Visit: Payer: Self-pay | Admitting: Internal Medicine

## 2011-10-28 ENCOUNTER — Encounter: Payer: Self-pay | Admitting: Internal Medicine

## 2011-10-28 DIAGNOSIS — Z1231 Encounter for screening mammogram for malignant neoplasm of breast: Secondary | ICD-10-CM

## 2011-11-25 ENCOUNTER — Ambulatory Visit
Admission: RE | Admit: 2011-11-25 | Discharge: 2011-11-25 | Disposition: A | Discharge status: Home / Self Care | Payer: Medicare Other | Source: Ambulatory Visit | Attending: Internal Medicine | Admitting: Internal Medicine

## 2011-11-25 DIAGNOSIS — Z1231 Encounter for screening mammogram for malignant neoplasm of breast: Secondary | ICD-10-CM

## 2011-12-08 ENCOUNTER — Encounter: Payer: Self-pay | Admitting: Internal Medicine

## 2011-12-08 ENCOUNTER — Ambulatory Visit (AMBULATORY_SURGERY_CENTER): Payer: Medicare Other | Admitting: *Deleted

## 2011-12-08 VITALS — Ht 64.0 in | Wt 148.3 lb

## 2011-12-08 DIAGNOSIS — Z1211 Encounter for screening for malignant neoplasm of colon: Secondary | ICD-10-CM

## 2011-12-08 MED ORDER — MOVIPREP 100 G PO SOLR: ORAL | Status: DC

## 2011-12-22 ENCOUNTER — Telehealth: Payer: Self-pay | Admitting: *Deleted

## 2011-12-22 ENCOUNTER — Encounter: Payer: Self-pay | Admitting: Internal Medicine

## 2011-12-22 ENCOUNTER — Ambulatory Visit (AMBULATORY_SURGERY_CENTER): Payer: Medicare Other | Admitting: Internal Medicine

## 2011-12-22 VITALS — BP 113/59 | HR 70 | Temp 97.6°F | Resp 20 | Ht 64.0 in | Wt 148.0 lb

## 2011-12-22 DIAGNOSIS — Z86010 Personal history of colonic polyps: Secondary | ICD-10-CM

## 2011-12-22 DIAGNOSIS — Z1211 Encounter for screening for malignant neoplasm of colon: Secondary | ICD-10-CM

## 2011-12-22 MED ORDER — SODIUM CHLORIDE 0.9 % IV SOLN: 500.0000 mL | INTRAVENOUS | Status: DC

## 2011-12-22 NOTE — Patient Instructions (Addendum)

## 2011-12-22 NOTE — Progress Notes (Signed)
Patient did not experience any of the following events: a burn prior to discharge; a fall within the facility; wrong site/side/patient/procedure/implant event; or a hospital transfer or hospital admission upon discharge from the facility. (G8907)  

## 2011-12-22 NOTE — Op Note (Signed)
[facility] [address] [place], [place]  [address]  COLONOSCOPY PROCEDURE REPORT  PATIENT:  Meredith, Hughes  MR#:  [id number] BIRTHDATE:  Mar 15, 1941, 71 yrs. old  GENDER:  female ENDOSCOPIST:  Wilhemina Bonito. Eda Keys, MD REF. BY:  Surveillance Program Recall, PROCEDURE DATE:  12/22/2011 PROCEDURE:  Surveillance Colonoscopy ASA CLASS:  Class II INDICATIONS:  history of pre-cancerous (adenomatous) colon polyps, surveillance and high-risk screening ; index 10-2005 w/ TAs MEDICATIONS:   MAC sedation, administered by CRNA, propofol (Diprivan) 290 mg IV  DESCRIPTION OF PROCEDURE:   After the risks benefits and alternatives of the procedure were thoroughly explained, informed consent was obtained.  Digital rectal exam was performed and revealed no abnormalities.   The LB CF-[id number] endoscope was introduced through the anus and advanced to the cecum, which was identified by both the appendix and ileocecal valve, without limitations.  The quality of the prep was excellent, using MoviPrep.  The instrument was then slowly withdrawn as the colon was fully examined. <<PROCEDUREIMAGES>>  FINDINGS:  A normal appearing cecum, ileocecal valve, and appendiceal orifice were identified. The ascending, hepatic flexure, transverse, splenic flexure, descending, sigmoid colon, and rectum appeared unremarkable.  No polyps or cancers were seen. Retroflexed views in the rectum revealed no abnormalities.    The time to cecum =  9:04  minutes. The scope was then withdrawn in 8:26  minutes from the cecum and the procedure completed.  COMPLICATIONS:  None  ENDOSCOPIC IMPRESSION: 1) Normal colon 2) No polyps or cancers  RECOMMENDATIONS: 1) Follow up colonoscopy in 5 years (personal hx adenomas)  ______________________________ Wilhemina Bonito. Eda Keys, MD  CC:  Geoffry Paradise, MD;  The Patient  n. eSIGNED:   Wilhemina Bonito. Eda Keys at 12/22/2011 02:20 PM  Corene Cornea, [phone]

## 2011-12-23 ENCOUNTER — Telehealth: Payer: Self-pay | Admitting: *Deleted

## 2011-12-23 NOTE — Telephone Encounter (Signed)
Called pt. To see if she left her umbrella at clinic.

## 2011-12-23 NOTE — Telephone Encounter (Signed)
  Follow up Call-  Call back number 12/22/2011  Post procedure Call Back phone  # [phone]  Permission to leave phone message Yes     Patient questions:  Do you have a fever, pain , or abdominal swelling? no Pain Score  0 *  Have you tolerated food without any problems? yes  Have you been able to return to your normal activities? yes  Do you have any questions about your discharge instructions: Diet   no Medications  no Follow up visit  no  Do you have questions or concerns about your Care? no  Actions: * If pain score is 4 or above: No action needed, pain <4.

## 2013-01-24 ENCOUNTER — Other Ambulatory Visit: Payer: Self-pay | Admitting: Internal Medicine

## 2013-01-24 ENCOUNTER — Other Ambulatory Visit: Payer: Self-pay

## 2013-01-24 DIAGNOSIS — Z1231 Encounter for screening mammogram for malignant neoplasm of breast: Secondary | ICD-10-CM

## 2013-02-18 ENCOUNTER — Ambulatory Visit
Admission: RE | Admit: 2013-02-18 | Discharge: 2013-02-18 | Disposition: A | Discharge status: Home / Self Care | Payer: Medicare Other | Source: Ambulatory Visit

## 2013-02-18 DIAGNOSIS — Z1231 Encounter for screening mammogram for malignant neoplasm of breast: Secondary | ICD-10-CM

## 2013-09-22 IMAGING — CR DG CHEST 2V
2 series · 2 of 2 positions shown · non-contrast
Comparison: 08/22/2009

CLINICAL DATA: Chills, body aches.

CHEST - 2 VIEW

[w chest pa]
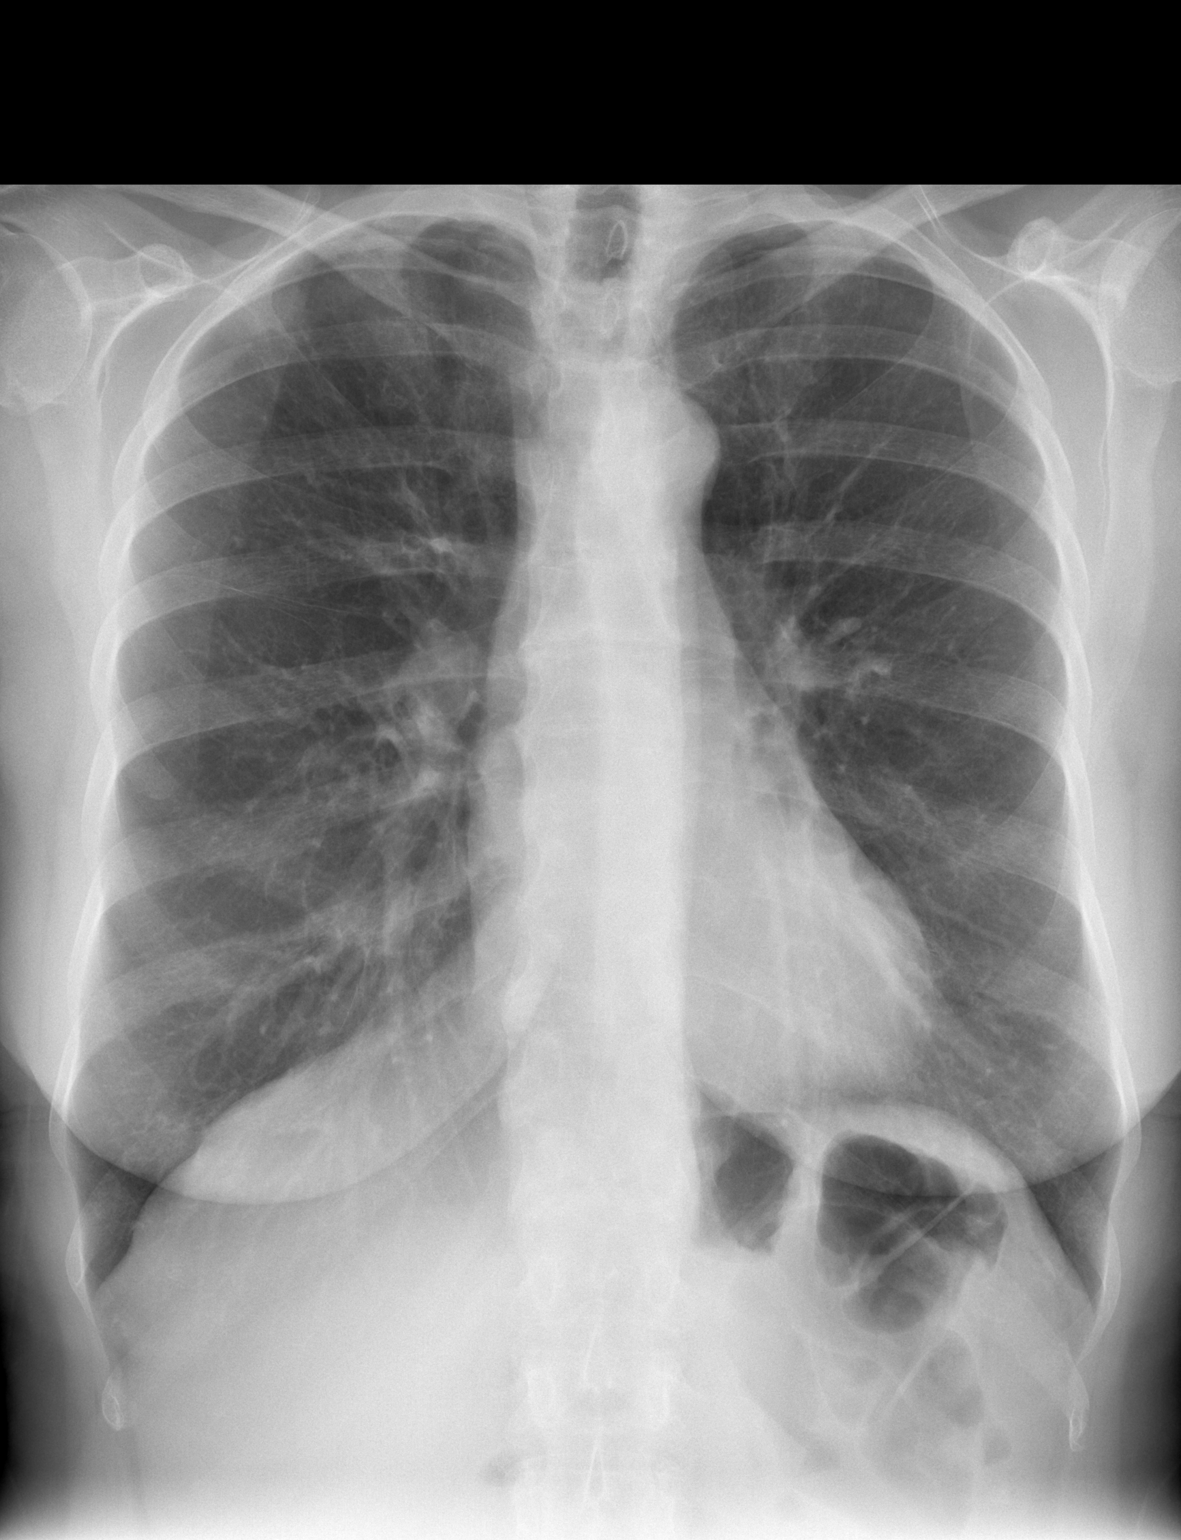

[w chest lat]
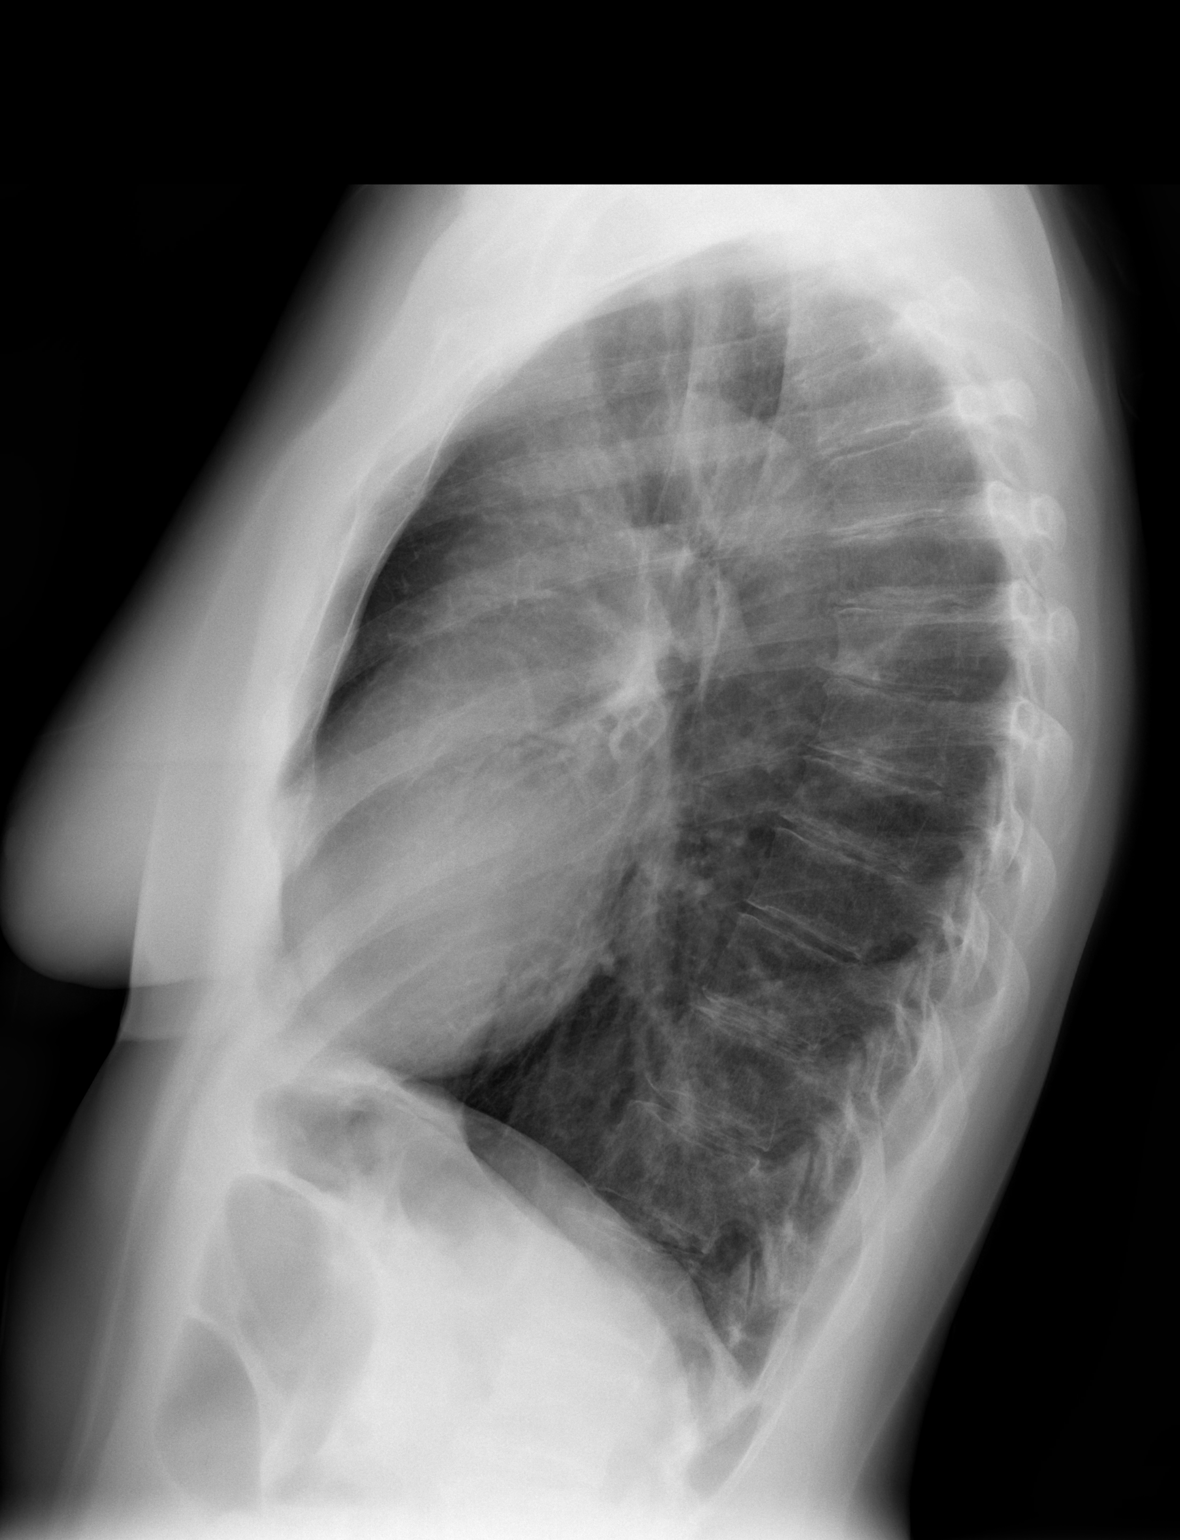

[2 of 2 positions shown; findings below may reference images not displayed]

FINDINGS: Cardiomediastinal contours within normal limits. Mild
patchy lower and middle lobe opacities.  Otherwise no focal
consolidation.  No pleural effusion or pneumothorax.  No acute
osseous abnormality.
IMPRESSION: Mild patchy lower/middle lobe opacities may reflect atelectasis or
early infiltrate.

## 2015-11-20 ENCOUNTER — Other Ambulatory Visit: Payer: Self-pay | Admitting: Internal Medicine

## 2015-11-20 DIAGNOSIS — Z139 Encounter for screening, unspecified: Secondary | ICD-10-CM

## 2015-11-27 ENCOUNTER — Ambulatory Visit
Admission: RE | Admit: 2015-11-27 | Discharge: 2015-11-27 | Disposition: A | Discharge status: Home / Self Care | Payer: Medicare Other | Source: Ambulatory Visit | Attending: Internal Medicine | Admitting: Internal Medicine

## 2015-11-27 DIAGNOSIS — Z139 Encounter for screening, unspecified: Secondary | ICD-10-CM

## 2016-10-22 ENCOUNTER — Encounter: Payer: Self-pay | Admitting: Internal Medicine

## 2017-11-16 ENCOUNTER — Other Ambulatory Visit: Payer: Self-pay | Admitting: Orthopedic Surgery

## 2017-11-18 ENCOUNTER — Other Ambulatory Visit: Payer: Self-pay

## 2017-11-18 ENCOUNTER — Encounter (HOSPITAL_BASED_OUTPATIENT_CLINIC_OR_DEPARTMENT_OTHER): Payer: Self-pay | Admitting: *Deleted

## 2017-11-24 ENCOUNTER — Encounter (HOSPITAL_BASED_OUTPATIENT_CLINIC_OR_DEPARTMENT_OTHER): Payer: Self-pay

## 2017-11-24 ENCOUNTER — Other Ambulatory Visit: Payer: Self-pay

## 2017-11-24 ENCOUNTER — Ambulatory Visit (HOSPITAL_BASED_OUTPATIENT_CLINIC_OR_DEPARTMENT_OTHER): Payer: Medicare Other | Admitting: Anesthesiology

## 2017-11-24 ENCOUNTER — Ambulatory Visit (HOSPITAL_BASED_OUTPATIENT_CLINIC_OR_DEPARTMENT_OTHER)
Admission: RE | Admit: 2017-11-24 | Discharge: 2017-11-24 | Disposition: A | Discharge status: Home / Self Care | Payer: Medicare Other | Source: Ambulatory Visit | Attending: Orthopedic Surgery | Admitting: Orthopedic Surgery

## 2017-11-24 ENCOUNTER — Encounter (HOSPITAL_BASED_OUTPATIENT_CLINIC_OR_DEPARTMENT_OTHER)
Admission: RE | Disposition: A | Discharge status: Home / Self Care | Payer: Self-pay | Source: Ambulatory Visit | Attending: Orthopedic Surgery

## 2017-11-24 DIAGNOSIS — M67442 Ganglion, left hand: Secondary | ICD-10-CM | POA: Diagnosis not present

## 2017-11-24 DIAGNOSIS — E785 Hyperlipidemia, unspecified: Secondary | ICD-10-CM | POA: Diagnosis not present

## 2017-11-24 DIAGNOSIS — Z79899 Other long term (current) drug therapy: Secondary | ICD-10-CM | POA: Diagnosis not present

## 2017-11-24 HISTORY — DX: Ganglion, left hand: M67.442

## 2017-11-24 HISTORY — PX: CYST EXCISION: SHX5701

## 2017-11-24 SURGERY — CYST REMOVAL
Anesthesia: Regional | Site: Finger | Laterality: Left

## 2017-11-24 MED ORDER — LIDOCAINE HCL (PF) 0.5 % IJ SOLN
INTRAMUSCULAR | Status: DC | PRN
  Administered 2017-11-24: 30 mL via INTRAVENOUS

## 2017-11-24 MED ORDER — SCOPOLAMINE 1 MG/3DAYS TD PT72: 1.0000 | MEDICATED_PATCH | Freq: Once | TRANSDERMAL | Status: DC | PRN

## 2017-11-24 MED ORDER — HYDROCODONE-ACETAMINOPHEN 5-325 MG PO TABS: 1.0000 | ORAL_TABLET | Freq: Four times a day (QID) | ORAL | 0 refills | Status: AC | PRN

## 2017-11-24 MED ORDER — FENTANYL CITRATE (PF) 100 MCG/2ML IJ SOLN
INTRAMUSCULAR | Status: AC
  Filled 2017-11-24: qty 2

## 2017-11-24 MED ORDER — FENTANYL CITRATE (PF) 100 MCG/2ML IJ SOLN: 25.0000 ug | INTRAMUSCULAR | Status: DC | PRN

## 2017-11-24 MED ORDER — PROPOFOL 500 MG/50ML IV EMUL
INTRAVENOUS | Status: DC | PRN
  Administered 2017-11-24: 50 ug/kg/min via INTRAVENOUS

## 2017-11-24 MED ORDER — CHLORHEXIDINE GLUCONATE 4 % EX LIQD: 60.0000 mL | Freq: Once | CUTANEOUS | Status: DC

## 2017-11-24 MED ORDER — ONDANSETRON HCL 4 MG/2ML IJ SOLN
INTRAMUSCULAR | Status: DC | PRN
  Administered 2017-11-24: 4 mg via INTRAVENOUS

## 2017-11-24 MED ORDER — PROPOFOL 10 MG/ML IV BOLUS
INTRAVENOUS | Status: AC
  Filled 2017-11-24: qty 20

## 2017-11-24 MED ORDER — MEPERIDINE HCL 25 MG/ML IJ SOLN: 6.2500 mg | INTRAMUSCULAR | Status: DC | PRN

## 2017-11-24 MED ORDER — BUPIVACAINE HCL (PF) 0.25 % IJ SOLN
INTRAMUSCULAR | Status: DC | PRN
  Administered 2017-11-24: 6 mL

## 2017-11-24 MED ORDER — OXYCODONE HCL 5 MG PO TABS: 5.0000 mg | ORAL_TABLET | Freq: Once | ORAL | Status: DC | PRN

## 2017-11-24 MED ORDER — LACTATED RINGERS IV SOLN
INTRAVENOUS | Status: DC
  Administered 2017-11-24: 11:00:00 via INTRAVENOUS

## 2017-11-24 MED ORDER — FENTANYL CITRATE (PF) 100 MCG/2ML IJ SOLN
50.0000 ug | INTRAMUSCULAR | Status: DC | PRN
  Administered 2017-11-24 (×2): 50 ug via INTRAVENOUS

## 2017-11-24 MED ORDER — CEFAZOLIN SODIUM-DEXTROSE 2-4 GM/100ML-% IV SOLN
2.0000 g | INTRAVENOUS | Status: AC
  Administered 2017-11-24: 2 g via INTRAVENOUS

## 2017-11-24 MED ORDER — CEFAZOLIN SODIUM-DEXTROSE 2-4 GM/100ML-% IV SOLN
INTRAVENOUS | Status: AC
  Filled 2017-11-24: qty 100

## 2017-11-24 MED ORDER — OXYCODONE HCL 5 MG/5ML PO SOLN: 5.0000 mg | Freq: Once | ORAL | Status: DC | PRN

## 2017-11-24 MED ORDER — ONDANSETRON HCL 4 MG/2ML IJ SOLN
INTRAMUSCULAR | Status: AC
  Filled 2017-11-24: qty 2

## 2017-11-24 MED ORDER — MIDAZOLAM HCL 2 MG/2ML IJ SOLN: 1.0000 mg | INTRAMUSCULAR | Status: DC | PRN

## 2017-11-24 SURGICAL SUPPLY — 50 items
BANDAGE COBAN STERILE 2 (GAUZE/BANDAGES/DRESSINGS) IMPLANT
BLADE MINI RND TIP GREEN BEAV (BLADE) IMPLANT
BLADE SURG 15 STRL LF DISP TIS (BLADE) ×1 IMPLANT
BLADE SURG 15 STRL SS (BLADE) ×3
BNDG CMPR 9X4 STRL LF SNTH (GAUZE/BANDAGES/DRESSINGS)
BNDG COHESIVE 1X5 TAN STRL LF (GAUZE/BANDAGES/DRESSINGS) ×2 IMPLANT
BNDG COHESIVE 3X5 TAN STRL LF (GAUZE/BANDAGES/DRESSINGS) IMPLANT
BNDG ESMARK 4X9 LF (GAUZE/BANDAGES/DRESSINGS) IMPLANT
BNDG GAUZE ELAST 4 BULKY (GAUZE/BANDAGES/DRESSINGS) IMPLANT
CHLORAPREP W/TINT 26ML (MISCELLANEOUS) ×3 IMPLANT
CORD BIPOLAR FORCEPS 12FT (ELECTRODE) ×3 IMPLANT
COVER BACK TABLE 60X90IN (DRAPES) ×3 IMPLANT
COVER MAYO STAND STRL (DRAPES) ×3 IMPLANT
CUFF TOURNIQUET SINGLE 18IN (TOURNIQUET CUFF) IMPLANT
DECANTER SPIKE VIAL GLASS SM (MISCELLANEOUS) IMPLANT
DRAIN PENROSE 1/2X12 LTX STRL (WOUND CARE) IMPLANT
DRAPE EXTREMITY T 121X128X90 (DRAPE) ×3 IMPLANT
DRAPE SURG 17X23 STRL (DRAPES) ×3 IMPLANT
GAUZE SPONGE 4X4 12PLY STRL (GAUZE/BANDAGES/DRESSINGS) ×3 IMPLANT
GAUZE XEROFORM 1X8 LF (GAUZE/BANDAGES/DRESSINGS) ×3 IMPLANT
GLOVE BIO SURGEON STRL SZ 6.5 (GLOVE) ×4 IMPLANT
GLOVE BIO SURGEONS STRL SZ 6.5 (GLOVE) ×2
GLOVE BIOGEL PI IND STRL 6.5 (GLOVE) ×1 IMPLANT
GLOVE BIOGEL PI IND STRL 8.5 (GLOVE) ×1 IMPLANT
GLOVE BIOGEL PI INDICATOR 6.5 (GLOVE) ×2
GLOVE BIOGEL PI INDICATOR 8.5 (GLOVE) ×2
GLOVE SURG ORTHO 8.0 STRL STRW (GLOVE) ×3 IMPLANT
GOWN STRL REUS W/ TWL LRG LVL3 (GOWN DISPOSABLE) ×1 IMPLANT
GOWN STRL REUS W/TWL LRG LVL3 (GOWN DISPOSABLE) ×9
GOWN STRL REUS W/TWL XL LVL3 (GOWN DISPOSABLE) ×3 IMPLANT
NDL PRECISIONGLIDE 27X1.5 (NEEDLE) IMPLANT
NEEDLE PRECISIONGLIDE 27X1.5 (NEEDLE) ×3 IMPLANT
NS IRRIG 1000ML POUR BTL (IV SOLUTION) ×3 IMPLANT
PACK BASIN DAY SURGERY FS (CUSTOM PROCEDURE TRAY) ×3 IMPLANT
PAD CAST 3X4 CTTN HI CHSV (CAST SUPPLIES) IMPLANT
PADDING CAST ABS 3INX4YD NS (CAST SUPPLIES)
PADDING CAST ABS 4INX4YD NS (CAST SUPPLIES)
PADDING CAST ABS COTTON 3X4 (CAST SUPPLIES) IMPLANT
PADDING CAST ABS COTTON 4X4 ST (CAST SUPPLIES) ×1 IMPLANT
PADDING CAST COTTON 3X4 STRL (CAST SUPPLIES)
SPLINT FINGER 5.25 BULB (SOFTGOODS) ×2 IMPLANT
SPLINT PLASTER CAST XFAST 3X15 (CAST SUPPLIES) IMPLANT
SPLINT PLASTER XTRA FASTSET 3X (CAST SUPPLIES)
STOCKINETTE 4X48 STRL (DRAPES) ×3 IMPLANT
SUT ETHILON 4 0 PS 2 18 (SUTURE) ×3 IMPLANT
SUT VIC AB 4-0 P2 18 (SUTURE) IMPLANT
SYR BULB 3OZ (MISCELLANEOUS) ×3 IMPLANT
SYR CONTROL 10ML LL (SYRINGE) ×2 IMPLANT
TOWEL GREEN STERILE FF (TOWEL DISPOSABLE) ×6 IMPLANT
UNDERPAD 30X30 (UNDERPADS AND DIAPERS) ×3 IMPLANT

## 2017-11-24 NOTE — Brief Op Note (Signed)
11/24/2017  12:10 PM  PATIENT:  Meredith Hughes  77 y.o. female  PRE-OPERATIVE DIAGNOSIS:  LEFT INDEX FINGER MUCOID CYST, DEGENERATIVE JOINT DISEASE DISTAL INTERPHALANGEAL JOINT  M79.645, M67.40  POST-OPERATIVE DIAGNOSIS:  LEFT INDEX FINGER MUCOID CYST, DEGENERATIVE JOINT DISEASE DISTAL INTERPHALANGEAL JOINT  M79.645, M67.40  PROCEDURE:  Procedure(s) with comments: EXCISION CYST, DEBRIDEMENT DEGENERATIVE JOINT DISEASE, ROTATION FLAP LEFT INDEX (Left) - index finger  SURGEON:  Surgeon(s) and Role:    * Cindee SaltKuzma, Maila Dukes, MD - Primary  PHYSICIAN ASSISTANT:   ASSISTANTS: none   ANESTHESIA:   local, regional and IV sedation  EBL:  2 mL   BLOOD ADMINISTERED:none  DRAINS: none   LOCAL MEDICATIONS USED:  BUPIVICAINE   SPECIMEN:  Excision  DISPOSITION OF SPECIMEN:  PATHOLOGY  COUNTS:  YES  TOURNIQUET:   Total Tourniquet Time Documented: Forearm (Left) - 29 minutes Total: Forearm (Left) - 29 minutes   DICTATION: .Reubin Milanragon Dictation  PLAN OF CARE: Discharge to home after PACU  PATIENT DISPOSITION:  PACU - hemodynamically stable.

## 2017-11-24 NOTE — Op Note (Signed)
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  Surgeon: Cindee SaltGary Syndey Jaskolski  Assistant: None  Anesthesia forearm IV regional IV sedation with metacarpal block  [facility]  History: The patient is a 77 year old female with a history of a large mass dorsal aspect of her index finger this has translucency of the skin.  It is obviously a cyst.  X-rays reveal degenerative changes.  She is admitted for excision of the cyst debridement of the joint with possible rotation dorsal skin flap.  She is aware that there is no guarantee to the surgery the possibility of infection recurrence injury to arteries nerves tendons complete relief symptoms distally possibility of partial loss of the flap.  Preoperative area the patient is seen extremity marked by both patient surgeon antibiotic given.  Procedure: Patient brought to the operating room where a forearm IV regional anesthetic was carried out without difficulty under the direction the anesthesia department.  She was prepped using ChloraPrep in supine position with a left arm free.  A three-minute dry time was allowed timeout taken to confirm patient procedure.  A curvilinear incision was made over the dorsal aspect of the distal phalangeal joint carried down along the ulnar part border of the index finger left hand.  The cyst was extremely large and noted distally.  With blunt sharp dissecting this was dissected free but left a very thin layer of skin present.  It was decided to proceed with creation of the flap.  Dorsal vascular structures were protected with each of the incisions.  The incision was then carried proximally and rotated around just distal to the proximal inner phalangeal joint back-up.  The dissection was  carried down through subcutaneous tissue and protecting neurovascular bundles and vessels dorsally.  The flap was then elevated.  The joint was opened debrided of osteophytes of the middle phalanx and a dorsal synovectomy performed with the house curette and hemostatic rondure.  Specimen was sent to pathology after excision of the cyst debridement of the joint.  The translucent skin was then excised.  The flap was then rotated into position covering the entire area of defect which measured approximately a centimeter in diameter.  The wound was copiously irrigated with saline.  The skin was then closed with interrupted 4 nylon sutures.  It was done with minimal tension to protect vascular structure.  A sterile compressive dressing dorsal splint including the PIP joint DIP joint was applied.  Patient tourniquet remaining fingers pink.  She was taken to the recovery room for observation in satisfactory condition.  She will be discharged home to [facility] in 1 week on ibuprofen and Tylenol with Norco as a back-up for breakthrough pain.

## 2017-11-24 NOTE — Anesthesia Preprocedure Evaluation (Addendum)
Anesthesia Evaluation  Patient identified by MRN, date of birth, ID band Patient awake    Reviewed: Allergy & Precautions, H&P , NPO status , Patient's Chart, lab work & pertinent test results, reviewed documented beta blocker date and time   Airway Mallampati: II  TM Distance: >3 FB Neck ROM: full    Dental  (+) Dental Advisory Given   Pulmonary neg pulmonary ROS,    breath sounds clear to auscultation       Cardiovascular negative cardio ROS   Rhythm:Regular     Neuro/Psych negative neurological ROS  negative psych ROS   GI/Hepatic negative GI ROS, Neg liver ROS,   Endo/Other  negative endocrine ROS  Renal/GU negative Renal ROS     Musculoskeletal   Abdominal   Peds  Hematology negative hematology ROS (+)   Anesthesia Other Findings See surgeon's H&P   Reproductive/Obstetrics negative OB ROS                            Anesthesia Physical  Anesthesia Plan  ASA: II  Anesthesia Plan: Bier Block and Bier Block-LIDOCAINE ONLY   Post-op Pain Management:    Induction: Intravenous  PONV Risk Score and Plan: 2 and Ondansetron, Propofol infusion and Treatment may vary due to age or medical condition  Airway Management Planned: Simple Face Mask  Additional Equipment:   Intra-op Plan:   Post-operative Plan:   Informed Consent: I have reviewed the patients History and Physical, chart, labs and discussed the procedure including the risks, benefits and alternatives for the proposed anesthesia with the patient or authorized representative who has indicated his/her understanding and acceptance.   Dental advisory given  Plan Discussed with: CRNA  Anesthesia Plan Comments:         Anesthesia Quick Evaluation

## 2017-11-24 NOTE — Transfer of Care (Signed)
Immediate Anesthesia Transfer of Care Note  Patient: Meredith Hughes  Procedure(s) Performed: EXCISION CYST, DEBRIDEMENT DEGENERATIVE JOINT DISEASE, ROTATION FLAP LEFT INDEX (Left Finger)  Patient Location: PACU  Anesthesia Type:MAC and Bier block  Level of Consciousness: awake, alert  and oriented  Airway & Oxygen Therapy: Patient Spontanous Breathing and Patient connected to face mask oxygen  Post-op Assessment: Report given to RN and Post -op Vital signs reviewed and stable  Post vital signs: Reviewed and stable  Last Vitals:  Vitals Value Taken Time  BP    Temp    Pulse 70 11/24/2017 12:09 PM  Resp 14 11/24/2017 12:09 PM  SpO2 97 % 11/24/2017 12:09 PM  Vitals shown include unvalidated device data.  Last Pain:  Vitals:   11/24/17 1033  TempSrc: Oral  PainSc: 0-No pain         Complications: No apparent anesthesia complications

## 2017-11-24 NOTE — Discharge Instructions (Addendum)

## 2017-11-24 NOTE — H&P (Signed)
Rise PaganiniWilma L Hughes is an 77 y.o. female.   Chief Complaint: mass left index finger HPI: Meredith StacksWilma is a 77 year old left-hand-dominant female former patient has not been seen since 2013. She comes in with a complaint of a mass forming on her left index finger distal phalangeal joint with deformity of her nail. She states is been present for approximately 3 weeks. She feels that it may be due to closing a drawer on it 5 weeks ago. She has not had any treatment for. It does not cause her any pain unless she hits it. She has a history of arthritis no history of diabetes thyroid problems or gout. Family history is negative for each of these. She has had mucoid cyst removed from another finger in the past.      Past Medical History:  Diagnosis Date  . 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 Hyperlipidemia     Past Surgical History:  Procedure Laterality Date  . BACK SURGERY    . BUNIONECTOMY     LT  . DENTAL SURGERY     IMPLANTS  . EYE SURGERY     LT CATARACT  . FOOT SURGERY     RT  . MASS EXCISION  08/25/2011   Procedure: EXCISION MASS;  Surgeon: Nicki ReaperGary R Sam Overbeck, MD;  Location: [facility];  Service: Orthopedics;  Laterality: Left;  excision cyst, debridement dip joint left middle finger    Family History  Problem Relation Age of Onset  . Colon cancer Neg Hx   . Stomach cancer Neg Hx    Social History:  reports that she has never smoked. She has never used smokeless tobacco. She reports that she drinks alcohol. She reports that she does not use drugs.  Allergies:  Allergies  Allergen Reactions  . Naproxen Rash    Rash on face  . Sulfa Antibiotics Rash    No medications prior to admission.    No results found for this or any previous visit (from the past 48 hour(s)).  No results found.   Pertinent items are noted in HPI.  There were no vitals taken for this visit.  General appearance: alert, cooperative and appears stated age Head:  Normocephalic, without obvious abnormality Neck: no JVD Resp: clear to auscultation bilaterally Cardio: regular rate and rhythm, S1, S2 normal, no murmur, click, rub or gallop GI: soft, non-tender; bowel sounds normal; no masses,  no organomegaly Extremities: mass left index finger Pulses: 2+ and symmetric Skin: Skin color, texture, turgor normal. No rashes or lesions Neurologic: Grossly normal Incision/Wound: na  Assessment/Plan Assessment:   Mucoid cyst of joint  Osteoarthritis of finger of left hand    Plan: We have discussed with her surgical excision. Pre-peri-and postoperative course are discussed along with risk applications. She is aware that there is no guarantee to the surgery the possibility of infection recurrence injury to arteries nerves tendons incomplete relief symptoms dystrophy. She is scheduled for excision mucoid cyst debridement distal phalangeal joint possible rotation flap left index finger. Questions are encouraged and answered for her. There is to be scheduled as an outpatient under regional anesthesia        Machelle Raybon R 11/24/2017, 9:36 AM

## 2017-11-24 NOTE — Anesthesia Procedure Notes (Signed)
Anesthesia Regional Block: Bier block (IV Regional)   Pre-Anesthetic Checklist: ,, timeout performed, Correct Patient, Correct Site, Correct Laterality, Correct Procedure,, site marked, surgical consent,, at surgeon's request Needles:  Injection technique: Single-shot  Needle Type: Other      Needle Gauge: 22     Additional Needles:   Procedures:,,,,, intact distal pulses, Esmarch exsanguination, single tourniquet utilized,  Narrative:  Start time: 11/24/2017 11:34 AM  Performed by: Personally

## 2017-11-25 ENCOUNTER — Encounter (HOSPITAL_BASED_OUTPATIENT_CLINIC_OR_DEPARTMENT_OTHER): Payer: Self-pay | Admitting: Orthopedic Surgery

## 2017-11-25 NOTE — Anesthesia Postprocedure Evaluation (Signed)
Anesthesia Post Note  Patient: Meredith Hughes  Procedure(s) Performed: EXCISION CYST, DEBRIDEMENT DEGENERATIVE JOINT DISEASE, ROTATION FLAP LEFT INDEX (Left Finger)     Patient location during evaluation: PACU Anesthesia Type: Bier Block Level of consciousness: awake and alert Pain management: pain level controlled Vital Signs Assessment: post-procedure vital signs reviewed and stable Respiratory status: spontaneous breathing Cardiovascular status: stable Anesthetic complications: no    Last Vitals:  Vitals:   11/24/17 1230 11/24/17 1250  BP: 128/67 135/60  Pulse: 64 61  Resp: 18   Temp:  36.6 C  SpO2: 97% 100%    Last Pain:  Vitals:   11/25/17 1037  TempSrc:   PainSc: 1    Pain Goal:                 Lewie LoronJohn Myrla Malanowski

## 2018-03-24 NOTE — Progress Notes (Signed)
Ms. Kyllo received her flu shot at the [facility] to her RT deltoid by the undersigned. Lot #[id number] NDC: 16109-604-54 Mfg: [id number] Exp: 11/30/18

## 2023-05-28 ENCOUNTER — Ambulatory Visit (INDEPENDENT_AMBULATORY_CARE_PROVIDER_SITE_OTHER): Payer: Medicare Other | Admitting: Otolaryngology

## 2023-05-28 ENCOUNTER — Encounter (INDEPENDENT_AMBULATORY_CARE_PROVIDER_SITE_OTHER): Payer: Self-pay | Admitting: Otolaryngology

## 2023-05-28 VITALS — BP 170/73 | HR 84 | Ht 63.0 in | Wt 129.0 lb

## 2023-05-28 DIAGNOSIS — K219 Gastro-esophageal reflux disease without esophagitis: Secondary | ICD-10-CM

## 2023-05-28 DIAGNOSIS — J383 Other diseases of vocal cords: Secondary | ICD-10-CM

## 2023-05-28 DIAGNOSIS — R490 Dysphonia: Secondary | ICD-10-CM | POA: Diagnosis not present

## 2023-05-28 DIAGNOSIS — R0982 Postnasal drip: Secondary | ICD-10-CM

## 2023-05-28 DIAGNOSIS — R0981 Nasal congestion: Secondary | ICD-10-CM | POA: Diagnosis not present

## 2023-05-28 DIAGNOSIS — J3089 Other allergic rhinitis: Secondary | ICD-10-CM

## 2023-05-28 NOTE — Progress Notes (Signed)
ENT CONSULT:  Reason for Consult: dysphonia x 5 months    HPI: Discussed the use of AI scribe software for clinical note transcription with the patient, who gave verbal consent to proceed.  History of Present Illness   The patient is an 82 year-old female, presents with a chief complaint of voice changes and hoarseness that began approximately five months prior. Prior to this, the patient's voice was reportedly smoother and less "croaky." The patient denies any associated pain but describes a different sensation when speaking. The patient denies any history of heartburn, reflux, surgery on the throat or head, neck area, trouble swallowing, shortness of breath, or tremors. The patient has a history of allergies, specifically to dust and certain types of grass, which are managed by avoiding allergens. The patient has been on 25 mcg Synthroid for the past six weeks, which is her first regular prescription medication. The patient denies any personal history of strokes. The patient's voice changes have been gradual and have not affected her ability to communicate effectively. The patient denies any difficulty in eating or swallowing. No recent intubations.      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    index finger   Hyperlipidemia     Past Surgical History:  Procedure Laterality Date   BACK SURGERY     BUNIONECTOMY     LT   CYST EXCISION Left 11/24/2017   Procedure: EXCISION CYST, DEBRIDEMENT DEGENERATIVE JOINT DISEASE, ROTATION FLAP LEFT INDEX;  Surgeon: Cindee Salt, MD;  Location: [facility];  Service: Orthopedics;  Laterality: Left;  index finger   DENTAL SURGERY     IMPLANTS   EYE SURGERY     LT CATARACT   FOOT SURGERY     RT   MASS EXCISION  08/25/2011   Procedure: EXCISION MASS;  Surgeon: Nicki Reaper, MD;  Location: [facility];  Service: Orthopedics;  Laterality: Left;  excision cyst, debridement dip joint  left middle finger    Family History  Problem Relation Age of Onset   Colon cancer Neg Hx    Stomach cancer Neg Hx     Social History:  reports that she has never smoked. She has never used smokeless tobacco. She reports current alcohol use. She reports that she does not use drugs.  Allergies:  Allergies  Allergen Reactions   Naproxen Rash    Rash on face   Sulfa Antibiotics Rash   Sulfamethoxazole-Trimethoprim Rash    Medications: I have reviewed the patient's current medications.  The PMH, PSH, Medications, Allergies, and SH were reviewed and updated.  ROS: Constitutional: Negative for fever, weight loss and weight gain. Cardiovascular: Negative for chest pain and dyspnea on exertion. Respiratory: Is not experiencing shortness of breath at rest. Gastrointestinal: Negative for nausea and vomiting. Neurological: Negative for headaches. Psychiatric: The patient is not nervous/anxious  Blood pressure (!) 170/73, pulse 84, height 5\' 3"  (1.6 m), weight 129 lb (58.5 kg).  PHYSICAL EXAM:  Exam: General: Well-developed, well-nourished Communication and Voice: poor projection slightly raspy Respiratory Respiratory effort: Equal inspiration and expiration without stridor Cardiovascular Peripheral Vascular: Warm extremities with equal color/perfusion Eyes: No nystagmus with equal extraocular motion bilaterally Neuro/Psych/Balance: Patient oriented to person, place, and time; Appropriate mood and affect; Gait is intact with no imbalance; Cranial nerves I-XII are intact Head and Face Inspection: Normocephalic and atraumatic without mass or lesion Palpation: Facial skeleton intact without bony stepoffs Salivary Glands: No  mass or tenderness Facial Strength: Facial motility symmetric and full bilaterally ENT Pinna: External ear intact and fully developed External canal: Canal is patent with intact skin Tympanic Membrane: Clear and mobile External Nose: No scar or anatomic  deformity Internal Nose: Septum is deviated to the left. No polyp, or purulence. Mucosal edema and erythema present.  Bilateral inferior turbinate hypertrophy.  Lips, Teeth, and gums: Mucosa and teeth intact and viable TMJ: No pain to palpation with full mobility Oral cavity/oropharynx: No erythema or exudate, no lesions present Nasopharynx: No mass or lesion with intact mucosa Hypopharynx: Intact mucosa without pooling of secretions Larynx Glottic: Full true vocal cord mobility without lesion or mass, evidence of VF atrophy and glottic insufficiency Supraglottic: Normal appearing epiglottis and AE folds Interarytenoid Space: Moderate pachydermia&edema Subglottic Space: Patent without lesion or edema Neck Neck and Trachea: Midline trachea without mass or lesion Thyroid: No mass or nodularity Lymphatics: No lymphadenopathy  Procedure:  Preoperative diagnosis: hoarseness  Postoperative diagnosis:   same  Procedure: Flexible fiberoptic laryngoscopy with stroboscopy (09811)  Surgeon: Ashok Croon, MD  Anesthesia: Topical lidocaine and Afrin  Complications: None  Condition is stable throughout exam  Indications and consent:   The patient presents to the clinic with hoarseness. All the risks, benefits, and potential complications were reviewed with the patient preoperatively and informed verbal consent was obtained.  Procedure: The patient was seated upright in the exam chair.   Topical lidocaine and Afrin were applied to the nasal cavity. After adequate anesthesia had occurred, the flexible telescope was passed into the nasal cavity. The nasopharynx was patent without mass or lesion. The scope was passed behind the soft palate and directed toward the base of tongue. The base of tongue was visualized and was symmetric with no apparent masses or abnormal appearing tissue. There were no signs of a mass or pooling of secretions in the piriform sinuses. The supraglottic structures were  normal.  The true vocal cords are mobile. The medial edges were bowed. Closure was incomplete with spindle shaped glottic gap. Periodicity present. The mucosal wave and amplitude were normal and intact. There is moderate interarytenoid pachydermia and post cricoid edema. The mucosa appears without lesions.   The laryngoscope was then slowly withdrawn and the patient tolerated the procedure well. There were no complications or blood loss.   Assessment/Plan: Encounter Diagnoses  Name Primary?   Dysphonia Yes   Age-related vocal fold atrophy    Glottic insufficiency    Chronic GERD    Post-nasal drip    Environmental and seasonal allergies    Nasal congestion     Assessment and Plan    Dysphonia for several months and evidence of Age-related Vocal  Fold atrophy on videostrobe exam today Presents with voice changes and hoarseness for five months. Examination revealed no significant pathology such as malignancy or lesions. Vocal folds were mobile but appeared thin, consistent with age-related atrophy. Discussed first-line treatment, voice therapy, which involves exercises to optimize breath support and voice projection. Discussed other interventions such as injection augmentation and thyroplasty. - Refer to voice therapy  - Ensure referral coordinator schedules voice therapy sessions near [facility]    Nasal congestion and post-nasal drip, suspected environmental allergies Allergies to dust and certain grasses. Manages by avoiding known allergens. No current use of allergy medications or nasal sprays.   - Discuss allergy management options, including nasal sprays - she will consider in the future, would like to hold off for now  Follow-up   -  Retake blood pressure before leaving the clinic.     Thank you for allowing me to participate in the care of this patient. Please do not hesitate to contact me with any questions or concerns.   Ashok Croon,  MD Otolaryngology [facility] Phone: [phone] Fax: [phone]    05/28/2023, 9:01 PM

## 2023-07-15 ENCOUNTER — Ambulatory Visit: Payer: Medicare Other | Admitting: Podiatry

## 2023-07-15 ENCOUNTER — Encounter: Payer: Self-pay | Admitting: Podiatry

## 2023-07-15 ENCOUNTER — Ambulatory Visit: Payer: Medicare Other

## 2023-07-15 DIAGNOSIS — L603 Nail dystrophy: Secondary | ICD-10-CM

## 2023-07-15 DIAGNOSIS — L97521 Non-pressure chronic ulcer of other part of left foot limited to breakdown of skin: Secondary | ICD-10-CM

## 2023-07-16 NOTE — Progress Notes (Signed)
  Subjective:  Patient ID: Meredith Hughes, female    DOB: 09-26-1940,  MRN: [id number]  Chief Complaint  Patient presents with   Nail Problem    "I think I have an ingrown toenail on my 3rd toe and I think I have a double nail on my left big toe."    83 y.o. female presents with the above complaint. History confirmed with patient.   Objective:  Physical Exam: warm, good capillary refill, no trophic changes or ulcerative lesions, normal DP and PT pulses, normal sensory exam, and medial border third toe right foot has slight incurvation no signs of infection active she has slight mallet toe contracture, transverse ridge in midportion of right hallux nail with new nail plate growing in.  Assessment:   1. Nail dystrophy      Plan:  Patient was evaluated and treated and all questions answered.  Debrided both nails with a sharp nail nipper to tolerance to alleviate the offending borders as well as utilizing a mechanical bur to remove the double portion of nail on the right hallux.  No active signs of infection.  Return as needed if this returns or worsens.  Return if symptoms worsen or fail to improve.

## 2023-08-25 ENCOUNTER — Telehealth: Payer: Self-pay | Admitting: Otolaryngology

## 2023-08-25 NOTE — Telephone Encounter (Signed)
 Called 2x (3-24 & 3-25), could leave VM, phone will ring and ring, then say call cannot be completed as dialed

## 2023-08-26 ENCOUNTER — Ambulatory Visit (INDEPENDENT_AMBULATORY_CARE_PROVIDER_SITE_OTHER): Payer: Medicare Other | Admitting: Otolaryngology

## 2024-04-18 ENCOUNTER — Other Ambulatory Visit: Payer: Self-pay | Admitting: Internal Medicine

## 2024-04-18 DIAGNOSIS — Z1231 Encounter for screening mammogram for malignant neoplasm of breast: Secondary | ICD-10-CM

## 2024-06-01 ENCOUNTER — Ambulatory Visit
Admission: RE | Admit: 2024-06-01 | Discharge: 2024-06-01 | Disposition: A | Discharge status: Home / Self Care | Source: Ambulatory Visit | Attending: Internal Medicine | Admitting: Internal Medicine

## 2024-06-01 DIAGNOSIS — Z1231 Encounter for screening mammogram for malignant neoplasm of breast: Secondary | ICD-10-CM | POA: Insufficient documentation
